# Patient Record
Sex: Female | Born: 1995 | Race: Black or African American | Hispanic: No | Marital: Single
Health system: Southern US, Community
[De-identification: ages and names within clinical notes are randomized; demographics above are authoritative.]

---

## 2015-06-25 ENCOUNTER — Emergency Department (HOSPITAL_COMMUNITY)
Admission: EM | Admit: 2015-06-25 | Discharge: 2015-06-25 | Disposition: A | Discharge status: Home / Self Care | Payer: Self-pay | Attending: Emergency Medicine | Admitting: Emergency Medicine

## 2015-06-25 ENCOUNTER — Emergency Department (HOSPITAL_COMMUNITY): Payer: Self-pay

## 2015-06-25 ENCOUNTER — Encounter (HOSPITAL_COMMUNITY): Payer: Self-pay | Admitting: Emergency Medicine

## 2015-06-25 DIAGNOSIS — S4991XA Unspecified injury of right shoulder and upper arm, initial encounter: Secondary | ICD-10-CM | POA: Insufficient documentation

## 2015-06-25 DIAGNOSIS — S50812A Abrasion of left forearm, initial encounter: Secondary | ICD-10-CM | POA: Insufficient documentation

## 2015-06-25 DIAGNOSIS — Y998 Other external cause status: Secondary | ICD-10-CM | POA: Insufficient documentation

## 2015-06-25 DIAGNOSIS — Z23 Encounter for immunization: Secondary | ICD-10-CM | POA: Insufficient documentation

## 2015-06-25 DIAGNOSIS — S6991XA Unspecified injury of right wrist, hand and finger(s), initial encounter: Secondary | ICD-10-CM | POA: Insufficient documentation

## 2015-06-25 DIAGNOSIS — S8991XA Unspecified injury of right lower leg, initial encounter: Secondary | ICD-10-CM | POA: Insufficient documentation

## 2015-06-25 DIAGNOSIS — S59901A Unspecified injury of right elbow, initial encounter: Secondary | ICD-10-CM | POA: Insufficient documentation

## 2015-06-25 DIAGNOSIS — S60512A Abrasion of left hand, initial encounter: Secondary | ICD-10-CM | POA: Insufficient documentation

## 2015-06-25 DIAGNOSIS — Y9389 Activity, other specified: Secondary | ICD-10-CM | POA: Insufficient documentation

## 2015-06-25 DIAGNOSIS — Y9289 Other specified places as the place of occurrence of the external cause: Secondary | ICD-10-CM | POA: Insufficient documentation

## 2015-06-25 MED ORDER — TETANUS-DIPHTH-ACELL PERTUSSIS 5-2.5-18.5 LF-MCG/0.5 IM SUSP
0.5000 mL | Freq: Once | INTRAMUSCULAR | Status: AC
  Administered 2015-06-25: 0.5 mL via INTRAMUSCULAR
  Filled 2015-06-25: qty 0.5

## 2015-06-25 MED ORDER — IBUPROFEN 800 MG PO TABS: 800.0000 mg | ORAL_TABLET | Freq: Three times a day (TID) | ORAL | Status: AC

## 2015-06-25 NOTE — ED Notes (Signed)
Patient transported to X-ray 
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
## 2015-06-25 NOTE — ED Notes (Signed)
PT DISCHARGED. INSTRUCTIONS AND PRESCRIPTION GIVEN. AAOX3. PT IN NO APPARENT DISTRESS. THE OPPORTUNITY TO ASK QUESTIONS WAS PROVIDED. 
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
## 2017-03-05 IMAGING — CR DG FINGER LITTLE 2+V*R*
3 series · 3 of 3 positions shown · non-contrast
Comparison: None.

CLINICAL DATA: Right fifth finger injury

EXAM:
RIGHT LITTLE FINGER 2+V

[x finger pa right]
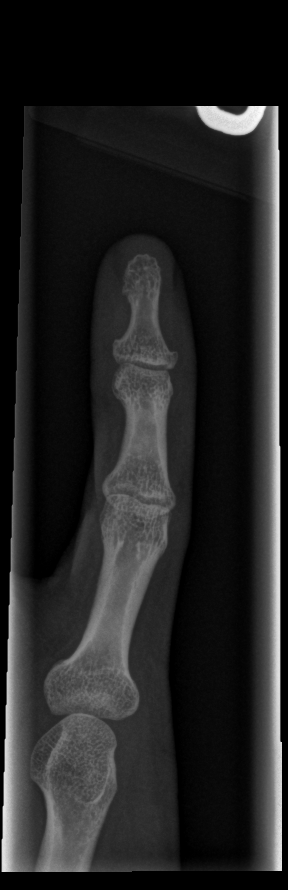

[x finger obl right]
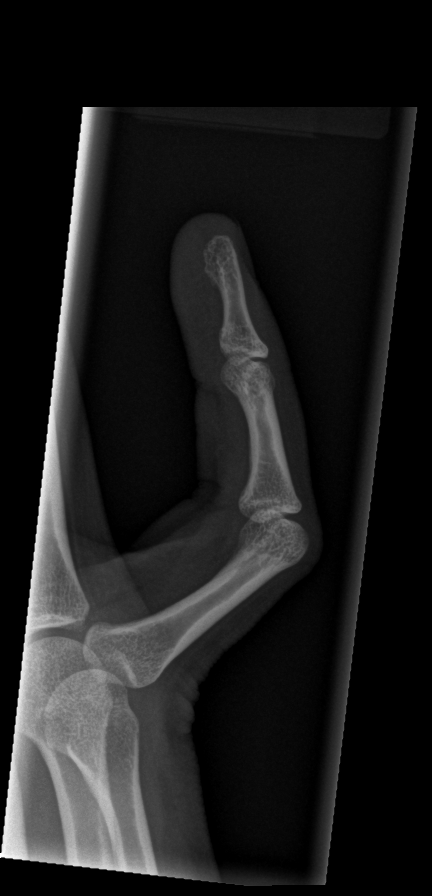

[x finger lat right]
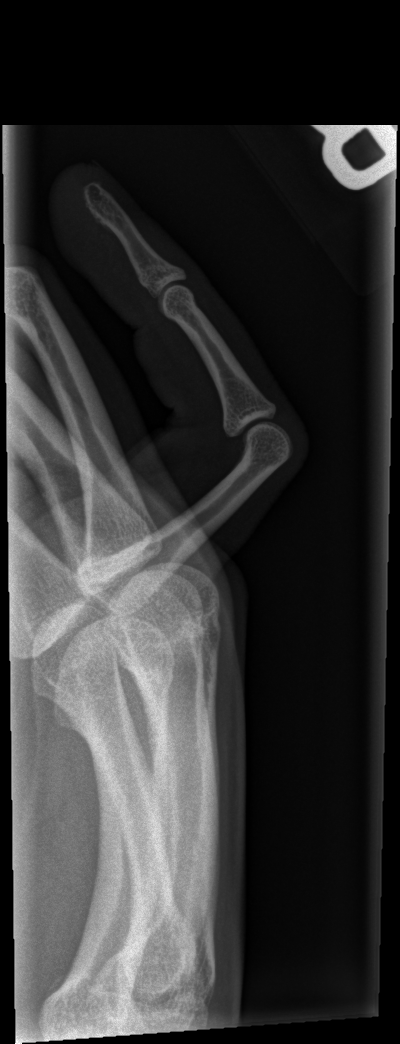

[3 of 3 positions shown; findings below may reference images not displayed]

FINDINGS: Flexion deformity at the proximal interphalangeal joint. No
fracture, dislocation, appreciable arthropathy cough focal osseous
lesions or radiopaque foreign body.
IMPRESSION: Flexion deformity at the PIP joint in the right fifth finger, with
no fracture or dislocation.
